# Patient Record
Sex: Male | Born: 1995 | Race: White | Hispanic: No | Marital: Single | Smoking: Never smoker
Health system: Southern US, Community
[De-identification: ages and names within clinical notes are randomized; demographics above are authoritative.]

## PROBLEM LIST (undated history)

## (undated) DIAGNOSIS — F319 Bipolar disorder, unspecified: Secondary | ICD-10-CM

## (undated) DIAGNOSIS — E559 Vitamin D deficiency, unspecified: Secondary | ICD-10-CM

## (undated) DIAGNOSIS — G43909 Migraine, unspecified, not intractable, without status migrainosus: Secondary | ICD-10-CM

## (undated) DIAGNOSIS — H409 Unspecified glaucoma: Secondary | ICD-10-CM

## (undated) DIAGNOSIS — F4310 Post-traumatic stress disorder, unspecified: Secondary | ICD-10-CM

## (undated) HISTORY — PX: FRACTURE SURGERY: SHX138

## (undated) HISTORY — PX: TONSILLECTOMY: SUR1361

## (undated) HISTORY — DX: Unspecified glaucoma: H40.9

## (undated) HISTORY — DX: Migraine, unspecified, not intractable, without status migrainosus: G43.909

## (undated) HISTORY — DX: Vitamin D deficiency, unspecified: E55.9

---
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
## 2015-05-21 ENCOUNTER — Encounter: Payer: BLUE CROSS/BLUE SHIELD | Attending: Family Medicine | Admitting: Dietician

## 2015-05-21 ENCOUNTER — Encounter: Payer: Self-pay | Admitting: Dietician

## 2015-05-21 VITALS — Ht 71.5 in | Wt 140.0 lb

## 2015-05-21 DIAGNOSIS — R634 Abnormal weight loss: Secondary | ICD-10-CM | POA: Diagnosis present

## 2015-05-21 NOTE — Progress Notes (Signed)
Medical Nutrition Therapy:  Appt start time: 0930 end time:  1030.   Assessment:  Primary concerns today: Patient is here alone due to unintentional weight loss.  He has lost from 165 lbs 12/2014 to today's weight of 140 lbs.   He states that this is due to giving up a lot of sweets, soda and candy to try to be healthier.  He also has had dental problems and his wisdom teeth out recently.  He has started supplementing with [employer] once daily.  He denies drug use, smokes a pack of cigarettes in 3 months, and drinks around 11 servings of alcohol once every 2 weeks (on one night).  He reports stopping marijuana 01/2015.   Hx includes newly diagnosed glaucoma as well as bipolar which is untreated.  "I have been on about 18 meds but I am allergic to most or they don't work."  He is polite, calm, and cooperative.  He sleeps 8-10 hours per night.  He lives with his grandparents.  He prepares simple foods and buys some of his own snacks.  He goes shopping with his grandmother who will buy him what he needs.  She also does most of the cooking.  His grandparents have custody of him and 3 other siblings since age 17 and has not contact with his parents.  He enjoys staying with his grandparents but wants to save to live in his own apartment.  He enjoys Road/Mountain Biking, rocks, and building things.  He currently works at a car wash.    Preferred Learning Style:   No preference indicated   Learning Readiness:   Ready  MEDICATIONS: see list   DIETARY INTAKE:  Usual eating pattern includes 3 meals and 1 snacks per day. 24-hr recall:  B ( AM): Acupuncturist with 2% milk with chicken biscuit at times OR Carnation and eggs, cheese toast Snk ( AM):  L ( PM): Peanut butter and banana sandwich Snk ( PM): Protein bar on occasion or Nature grain bars or Poptart and Acai covered in chocolate or cashews or jerky or trail mix D ( PM): meat (beef, pork most often), starch OR  soup OR fish with french fries and vegetables on Friday Snk ( PM): cheese and salami sandwich with mustard on [employer] or white bread Beverages: gatorade, hot tea with honey, OJ fortified with vitamin D and Calcium, smoothie made with apple juice, milk, rare soda 11 servings of etoh on one day every 2 weeks, coffee rarely  Usual physical activity: works out with weights 1-2 times per week (2 hours), One 7 mile bike ride weekly and more as weather warme up.  Daily light weights at home.  Estimated energy needs: 2400-2550 calories 75 g protein  Progress Towards Goal(s):  In progress.   Nutritional Diagnosis:  NB-1.1 Food and nutrition-related knowledge deficit As related to weight maintenancy/gain.  As evidenced by diet hx and weight trend..    Intervention:  Nutrition counseling/education for weight gain.  Breakfast, Lunch, Dinner daily.  Do not skip meals. Snack twice a day.   2 Carnation Breakfast daily Milk with all meals Eat more when you are more physically active. Choose foods with increased calories.  Dried fruit, nuts, trail mix    Smoothies  Butter, peanut butter, sour cream, cheese  Protein bars  Olive oil, butter Keep a food diary. (Time, food, amount) as well as activity amount and type.  Teaching Method Utilized:  Auditory  Handouts given during visit include:  Underweight nutrition therapy  Barriers to learning/adherence to lifestyle change: none  Demonstrated degree of understanding via:  Teach Back   Monitoring/Evaluation:  Dietary intake, exercise, and body weight in 1 month(s).

## 2015-05-21 NOTE — Patient Instructions (Signed)
Breakfast, Lunch, Dinner daily.  Do not skip meals. Snack twice a day.   2 Carnation Breakfast daily Milk with all meals Eat more when you are more physically active. Choose foods with increased calories.  Dried fruit, nuts, trail mix    Smoothies  Butter, peanut butter, sour cream, cheese  Protein bars  Olive oil, butter Keep a food diary. (Time, food, amount)

## 2015-06-04 ENCOUNTER — Ambulatory Visit: Payer: Medicaid Other | Admitting: Skilled Nursing Facility1

## 2015-06-06 ENCOUNTER — Other Ambulatory Visit: Payer: Self-pay | Admitting: Family Medicine

## 2015-06-07 ENCOUNTER — Other Ambulatory Visit: Payer: Self-pay | Admitting: Family Medicine

## 2015-06-07 DIAGNOSIS — G43809 Other migraine, not intractable, without status migrainosus: Secondary | ICD-10-CM

## 2015-06-17 ENCOUNTER — Ambulatory Visit
Admission: RE | Admit: 2015-06-17 | Discharge: 2015-06-17 | Disposition: A | Discharge status: Home / Self Care | Payer: BLUE CROSS/BLUE SHIELD | Source: Ambulatory Visit | Attending: Family Medicine | Admitting: Family Medicine

## 2015-06-17 DIAGNOSIS — G43809 Other migraine, not intractable, without status migrainosus: Secondary | ICD-10-CM

## 2015-06-24 ENCOUNTER — Encounter: Payer: BLUE CROSS/BLUE SHIELD | Attending: Family Medicine | Admitting: Dietician

## 2015-06-24 ENCOUNTER — Encounter: Payer: Self-pay | Admitting: Dietician

## 2015-06-24 VITALS — Wt 146.0 lb

## 2015-06-24 DIAGNOSIS — R634 Abnormal weight loss: Secondary | ICD-10-CM | POA: Diagnosis present

## 2015-06-24 NOTE — Progress Notes (Signed)
Medical Nutrition Therapy:  Appt start time: 1400 end time:  1430.  Assessment:  05/21/15 Primary concerns today: Patient is here alone due to unintentional weight loss.  He has lost from 165 lbs 12/2014 to today's weight of 140 lbs.   He states that this is due to giving up a lot of sweets, soda and candy to try to be healthier.  He also has had dental problems and his wisdom teeth out recently.  He has started supplementing with [employer] once daily.  He denies drug use, smokes a pack of cigarettes in 3 months, and drinks around 11 servings of alcohol once every 2 weeks (on one night).  He reports stopping marijuana 01/2015.   Hx includes newly diagnosed glaucoma as well as bipolar which is untreated.  "I have been on about 18 meds but I am allergic to most or they don't work."  He is polite, calm, and cooperative.  He sleeps 8-10 hours per night.  He lives with his grandparents.  He prepares simple foods and buys some of his own snacks.  He goes shopping with his grandmother who will buy him what he needs.  She also does most of the cooking.  His grandparents have custody of him and 3 other siblings since age 87 and has not contact with his parents.  He enjoys staying with his grandparents but wants to save to live in his own apartment.  He enjoys Road/Mountain Biking, rocks, and building things.  He currently works at a car wash.    06/24/15: Patient is here alone.  Weight has increased to 146 lbs.  He still lives with his grandparents.  He has started medication for migraines.  His appetite has improved.  He has started to include more high calorie foods in his diet.  He continues to work out but has not been biking much due to the cold.  He is not working at [employer] but has his application in at [employer].  He kept a food journal for about a week but then stopped.  He did not bring it.  He states that his sleep schedule is still off.  Preferred Learning Style:   No preference  indicated   Learning Readiness:   Ready  MEDICATIONS: see list   DIETARY INTAKE:  Usual eating pattern includes 3 meals and 2-3 snacks per day. 24-hr recall:  B ( AM): [employer] Breakfast Essentials with 2% milk with 2 blueberry muffins and coffee with increased sugar Snk ( AM):   Cheese toast L ( PM): pizza (domino's) Snk ( PM): poptarts or pie or fruit cup or chips and salasa D ( PM): meat (beef, pork most often), starch OR soup OR fish with french fries and vegetables on Friday OR Homemade pizza yesterday.   Snk ( PM): cheese toast Beverages: gatorade, hot tea with honey, OJ fortified with vitamin D and Calcium, smoothie made with apple juice, milk, rare soda but daily Joker's energy drinks, coffee and has stopped most etoh due to addition of migraine medicine. In the past:  11 servings of etoh on one day every 2 weeks  Usual physical activity: works out with weights 1-2 times per week (2 hours), One 7 mile bike ride weekly and more as weather warme up.  Daily light weights at home.  Estimated energy needs: 2400-2550 calories 75 g protein  Progress Towards Goal(s):  In progress.   Nutritional Diagnosis:  NB-1.1 Food and nutrition-related knowledge deficit As related to weight maintenancy/gain.  As evidenced by diet hx and weight trend..    Intervention:  Nutrition counseling/education for weight gain continued.  Also discussed the nutrition quality of the diet and how to improve this.  Continue 3 meals per day plus snacks. Remember superfoods. Continue the [employer] Breakfast daily. Pack snacks when you bike or hike.  Dried fruit, nuts, trail mix  Jerky   Protein bars  Peanut butter sandwiches Continue the great work!  Teaching Method Utilized:  Auditory  Handouts given during visit include:  Underweight nutrition therapy  Barriers to learning/adherence to lifestyle change: none  Demonstrated degree of understanding via:  Teach Back   Monitoring/Evaluation:   Dietary intake, exercise, and body weight prn.

## 2015-06-24 NOTE — Patient Instructions (Addendum)
Continue 3 meals per day plus snacks. Remember superfoods. Continue the [employer] Breakfast daily. Pack snacks when you bike or hike.  Dried fruit, nuts, trail mix  Jerky   Protein bars  Peanut butter sandwiches Continue the great work!

## 2017-08-16 IMAGING — MR MR HEAD W/O CM
8 series · 38 of 48 positions shown · non-contrast
Comparison: None.

CLINICAL DATA: Headaches and weight loss.  Migraine headaches.

EXAM:
MRI HEAD WITHOUT CONTRAST
TECHNIQUE: Multiplanar, multiecho pulse sequences of the brain and surrounding
structures were obtained without intravenous contrast.

[Series 2: T1 · sagittal · 5.0mm · 0.45mm/px · 1 of 19 slices shown]
[im 1/19]
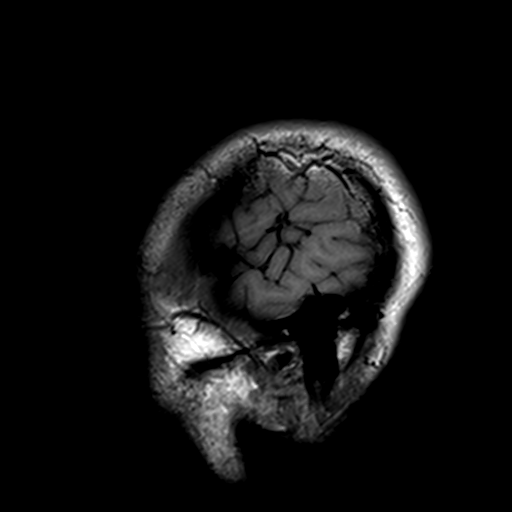

[Series 3: T2 · axial · 5.0mm · 0.45mm/px · z∈[-68,+72]mm · 3 of 24 slices shown (1 of 2)]
[im 1/24]
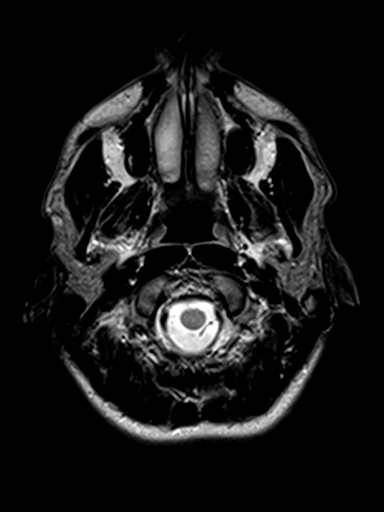
[im 12/24]
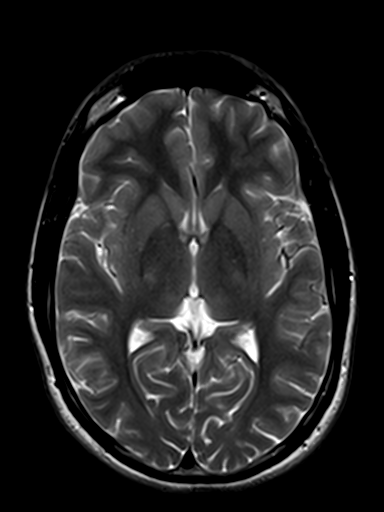
[im 24/24]
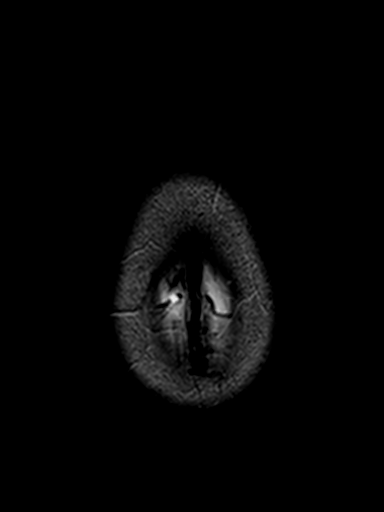

[Series 4: DWI · axial · 3.0mm · 0.94mm/px · z∈[-66,+71]mm · 9 of 96 slices shown]
[im 1/96]
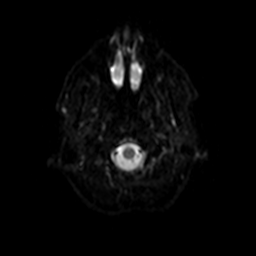
[im 18/96]
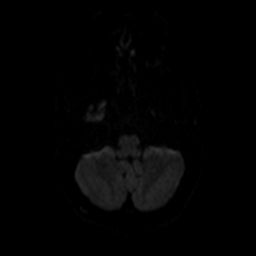
[im 26/96]
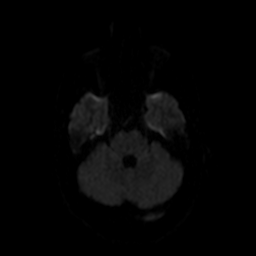
[im 44/96]
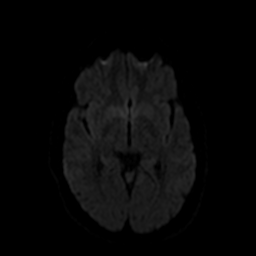
[im 52/96]
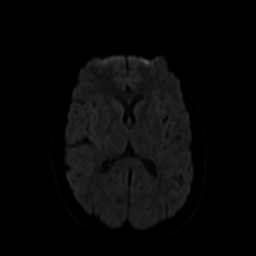
[im 70/96]
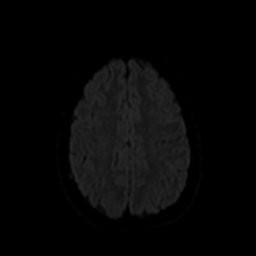
[im 78/96]
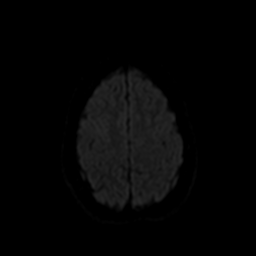
[im 87/96]
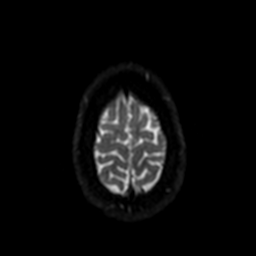
[im 96/96]
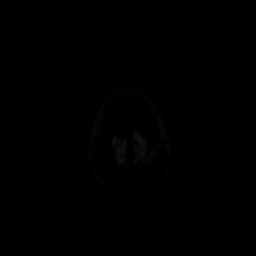

[Series 5: dwi_adc · axial · 3.0mm · 0.94mm/px · z∈[-66,+71]mm · 6 of 48 slices shown]
[im 1/48]
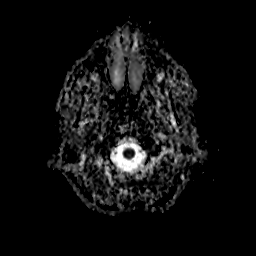
[im 10/48]
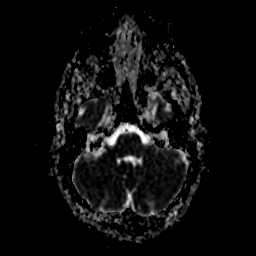
[im 19/48]
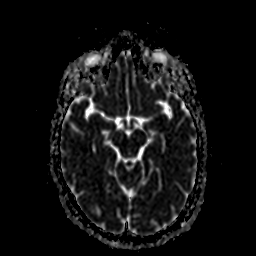
[im 29/48]
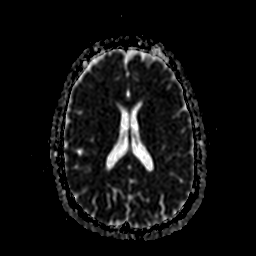
[im 38/48]
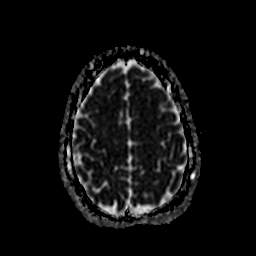
[im 48/48]
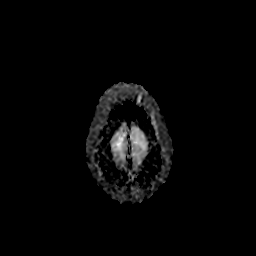

[Series 6: FLAIR · axial · 5.0mm · 0.45mm/px · z∈[-68,+72]mm · 3 of 24 slices shown]
[im 1/24]
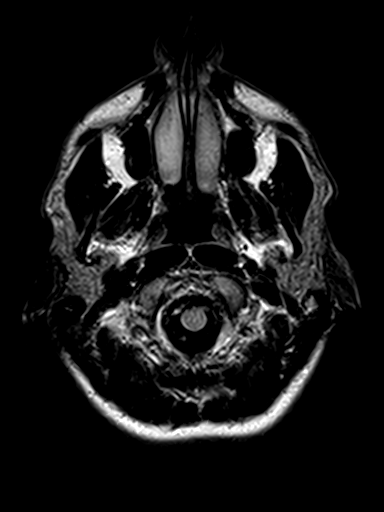
[im 12/24]
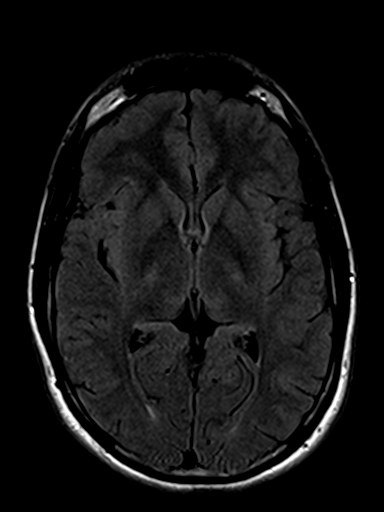
[im 24/24]
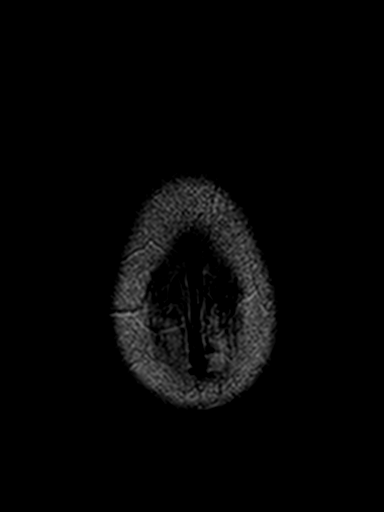

[Series 7: axial (person_name)1 volume · axial · 2.0mm · 0.45mm/px · z∈[-75,+79]mm · 8 of 80 slices shown]
[im 1/80]
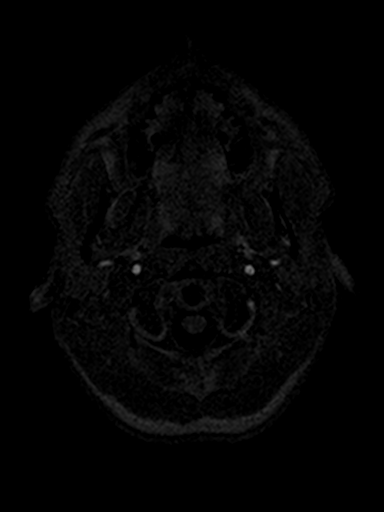
[im 9/80]
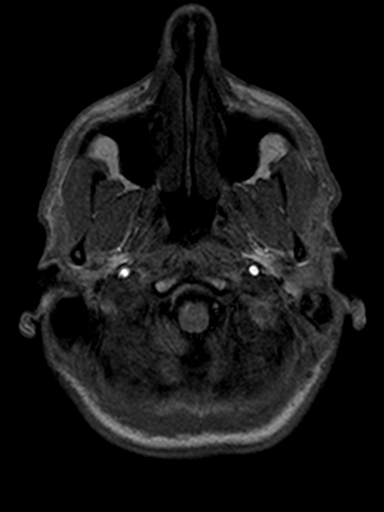
[im 27/80]
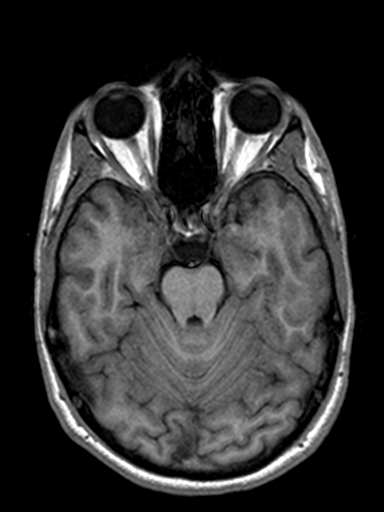
[im 36/80]
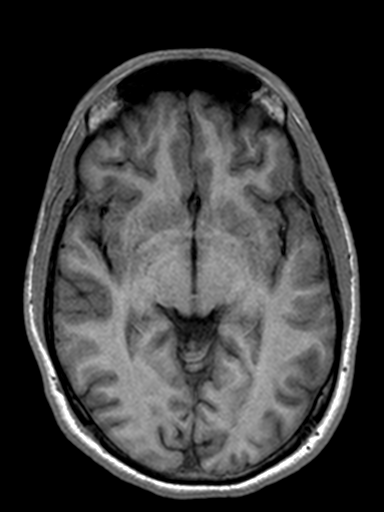
[im 44/80]
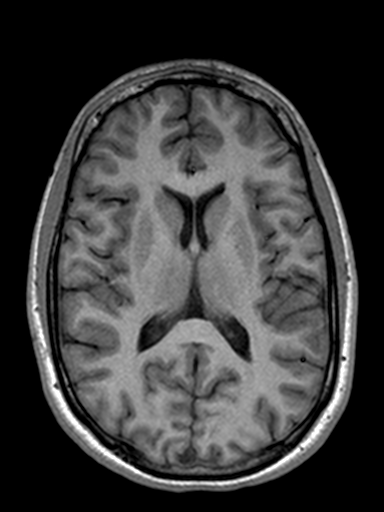
[im 53/80]
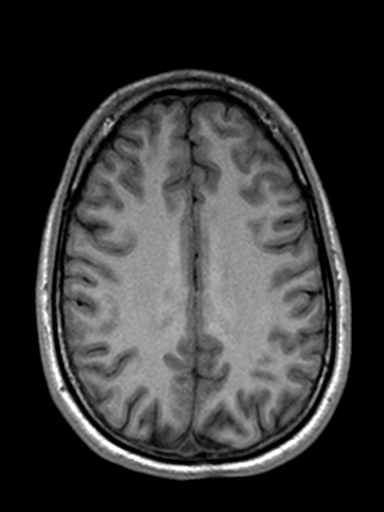
[im 71/80]
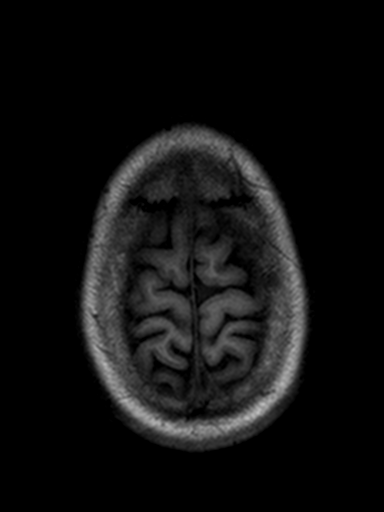
[im 80/80]
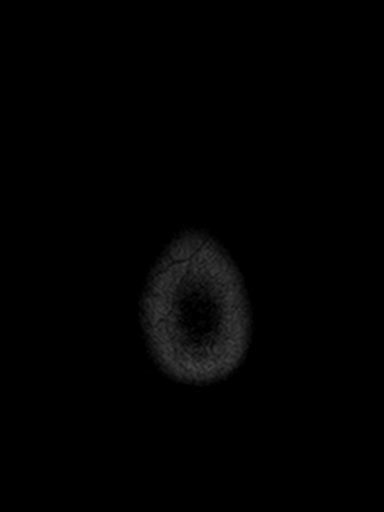

[Series 9: swi_images · axial · 2.0mm · 0.90mm/px · z∈[-75,+9]mm · 5 of 80 slices shown]
[im 1/80]
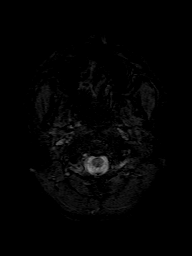
[im 9/80]
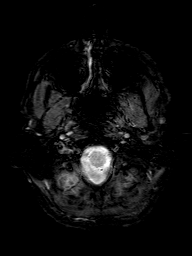
[im 27/80]
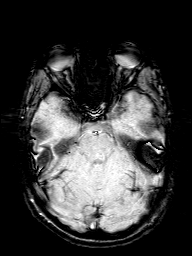
[im 36/80]
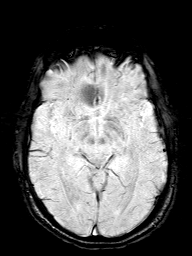
[im 44/80]
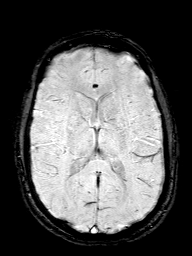

[Series 10: T2 · coronal · 5.0mm · 0.45mm/px · 3 of 24 slices shown (2 of 2)]
[im 1/24]
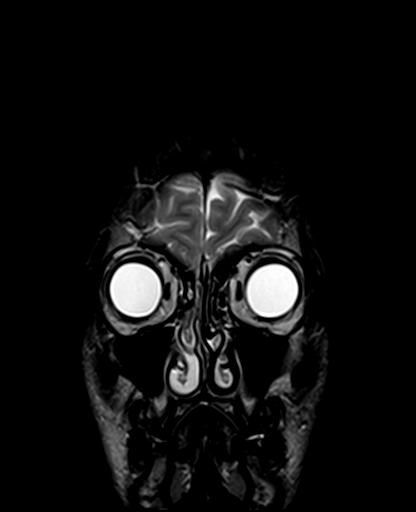
[im 12/24]
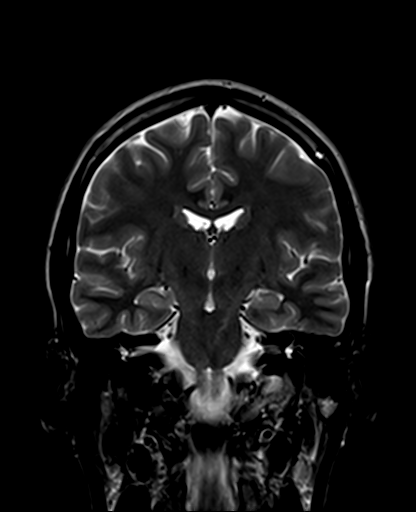
[im 24/24]
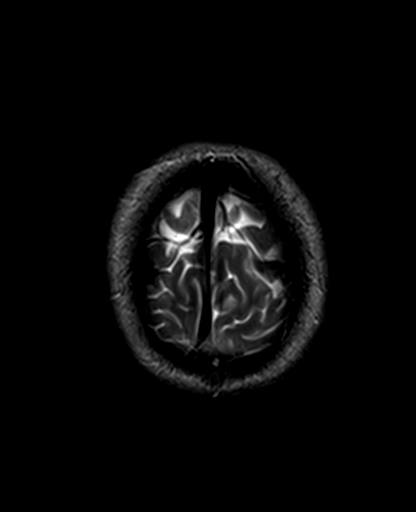

[38 of 48 positions shown; findings below may reference images not displayed]

FINDINGS: No acute infarct, hemorrhage, or mass lesion is present. The
ventricles are of normal size. No significant extraaxial fluid
collection is present.

No significant white matter disease is present. The internal
auditory canals are within normal limits bilaterally. The brainstem
and cerebellum are normal.

Flow is present in the major intracranial arteries. The globes and
orbits are intact. The paranasal sinuses are clear. There is some
fluid in left mastoid air cells. No obstructing nasopharyngeal
lesion is evident.

The skullbase is within normal limits. Midline sagittal images are
unremarkable.
IMPRESSION: 1. Negative MRI of the brain.
2. Minimal left mastoid fluid. No obstructing nasopharyngeal lesion
is evident.

## 2019-12-06 DEATH — deceased
